# Patient Record
Sex: Female | Born: 2000 | Race: White | Hispanic: No | Marital: Single | Smoking: Never smoker
Health system: Southern US, Community
[De-identification: ages and names within clinical notes are randomized; demographics above are authoritative.]

## PROBLEM LIST (undated history)

## (undated) DIAGNOSIS — G43109 Migraine with aura, not intractable, without status migrainosus: Secondary | ICD-10-CM

## (undated) DIAGNOSIS — E039 Hypothyroidism, unspecified: Secondary | ICD-10-CM

## (undated) DIAGNOSIS — F4310 Post-traumatic stress disorder, unspecified: Secondary | ICD-10-CM

## (undated) DIAGNOSIS — E559 Vitamin D deficiency, unspecified: Secondary | ICD-10-CM

## (undated) DIAGNOSIS — F32A Depression, unspecified: Secondary | ICD-10-CM

## (undated) DIAGNOSIS — A048 Other specified bacterial intestinal infections: Secondary | ICD-10-CM

## (undated) DIAGNOSIS — F419 Anxiety disorder, unspecified: Secondary | ICD-10-CM

## (undated) DIAGNOSIS — N926 Irregular menstruation, unspecified: Secondary | ICD-10-CM

## (undated) HISTORY — DX: Vitamin D deficiency, unspecified: E55.9

## (undated) HISTORY — DX: Post-traumatic stress disorder, unspecified: F43.10

## (undated) HISTORY — DX: Hypothyroidism, unspecified: E03.9

## (undated) HISTORY — DX: Anxiety disorder, unspecified: F41.9

## (undated) HISTORY — DX: Depression, unspecified: F32.A

## (undated) HISTORY — DX: Migraine with aura, not intractable, without status migrainosus: G43.109

## (undated) HISTORY — DX: Other specified bacterial intestinal infections: A04.8

## (undated) HISTORY — DX: Irregular menstruation, unspecified: N92.6

## (undated) HISTORY — PX: WISDOM TOOTH EXTRACTION: SHX21

---
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
## 2022-09-04 IMAGING — US US PELVIS COMPLETE
1 series · 14 of 25 positions shown · non-contrast
Comparison: None.

CLINICAL DATA: Pelvic pain

EXAM:
TRANSABDOMINAL ULTRASOUND OF PELVIS
TECHNIQUE: Transabdominal ultrasound examination of the pelvis was performed
including evaluation of the uterus, ovaries, adnexal regions, and
pelvic cul-de-sac.

[Series 1: us pelvis complete · 0.22mm/px · 14 of 48 slices shown]
[im 1/48]
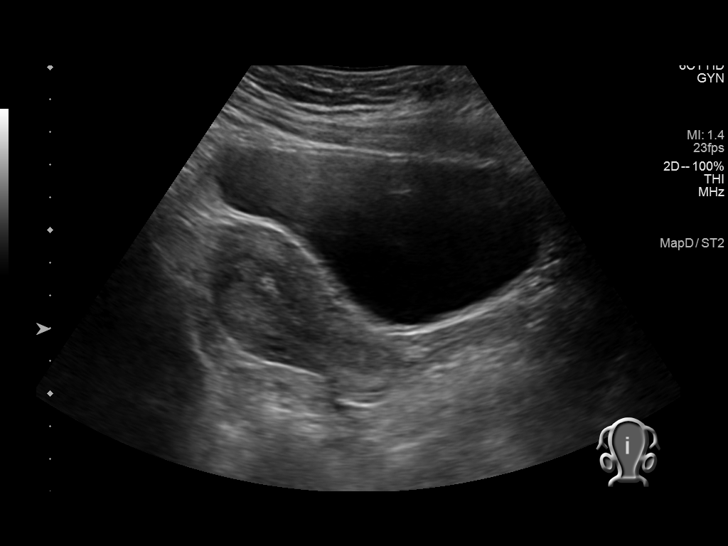
[im 4/48]
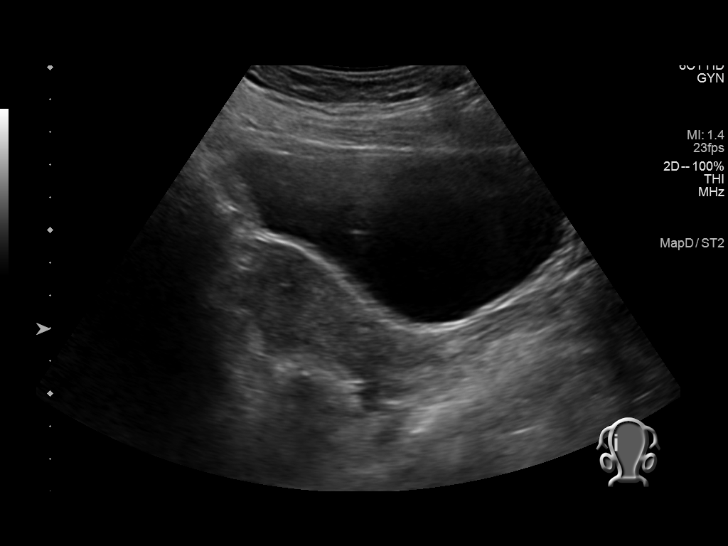
[im 8/48]
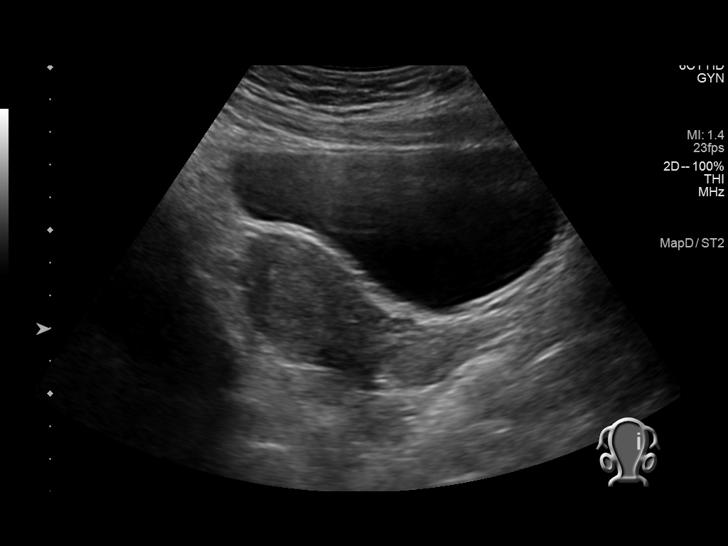
[im 12/48]
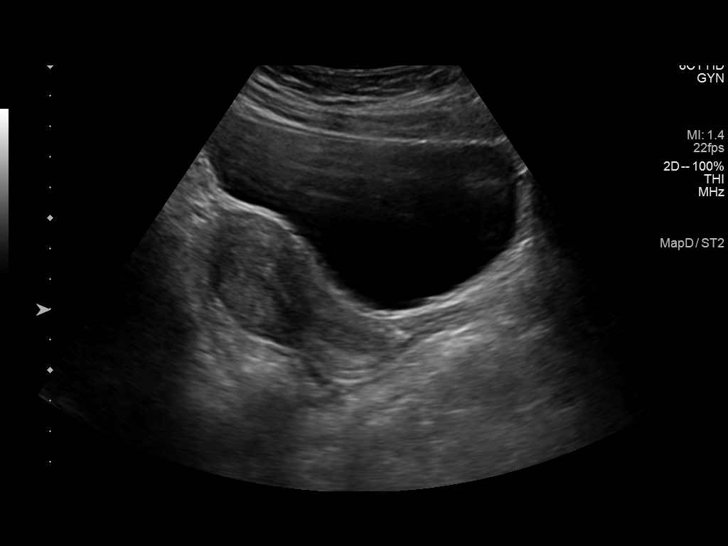
[im 16/48]
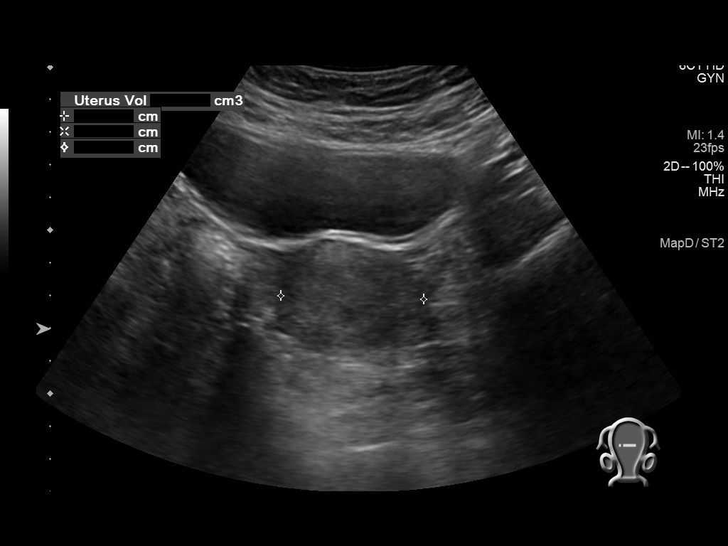
[im 18/48]
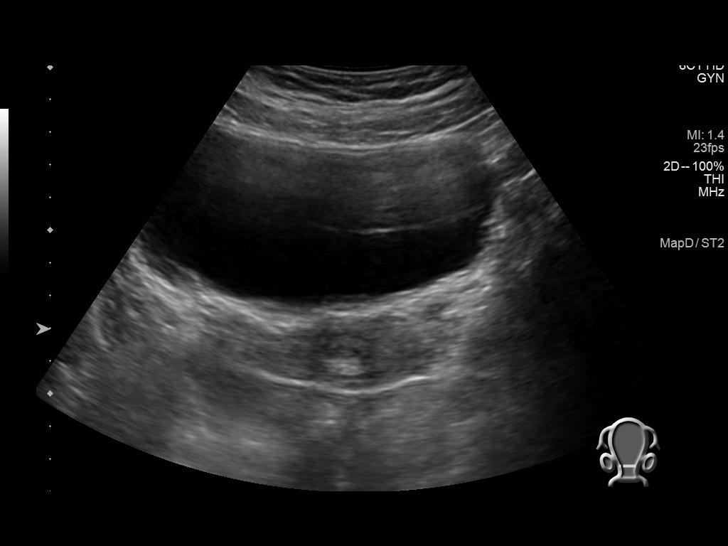
[im 22/48]
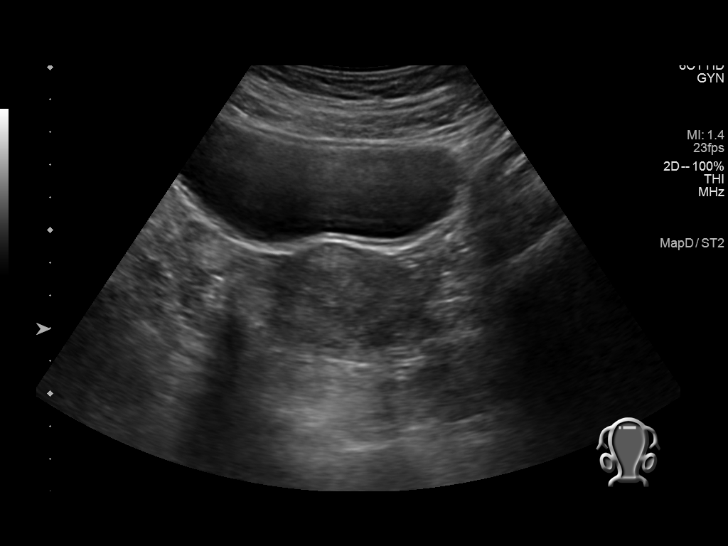
[im 26/48]
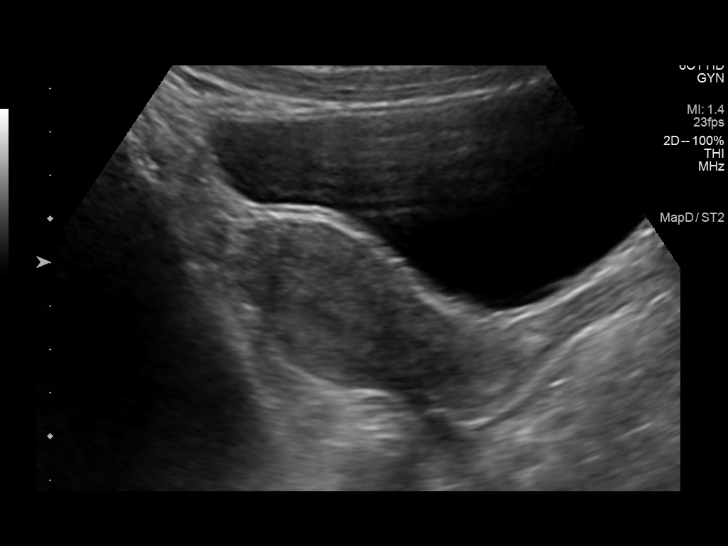
[im 30/48]
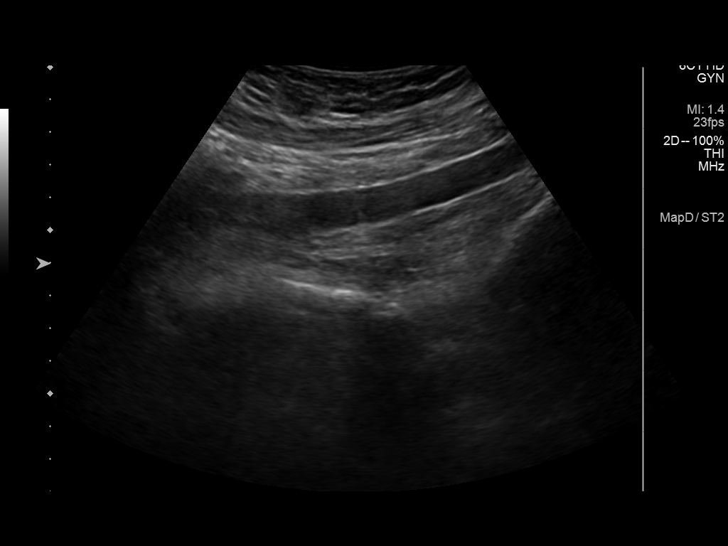
[im 32/48]
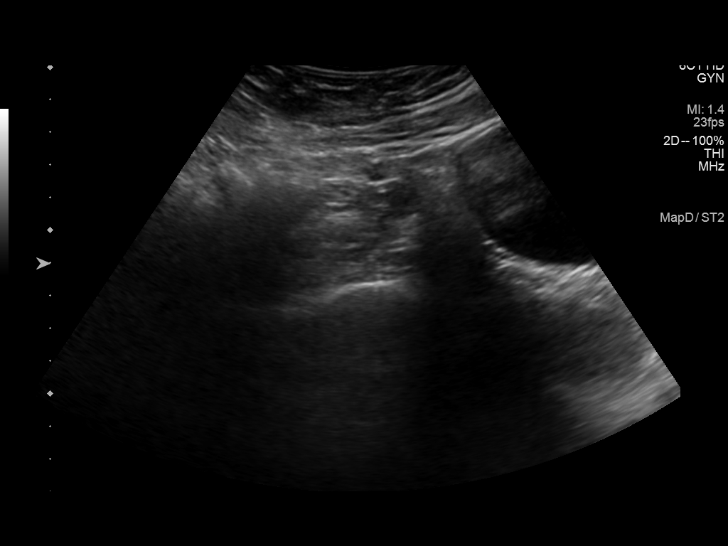
[im 36/48]
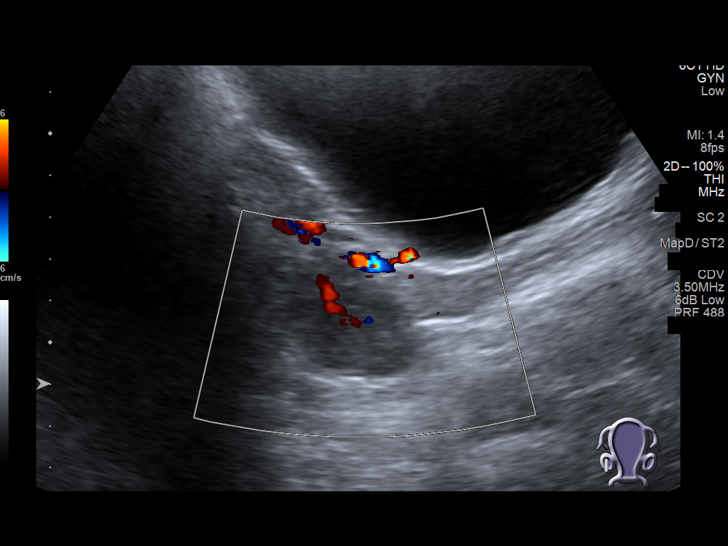
[im 40/48]
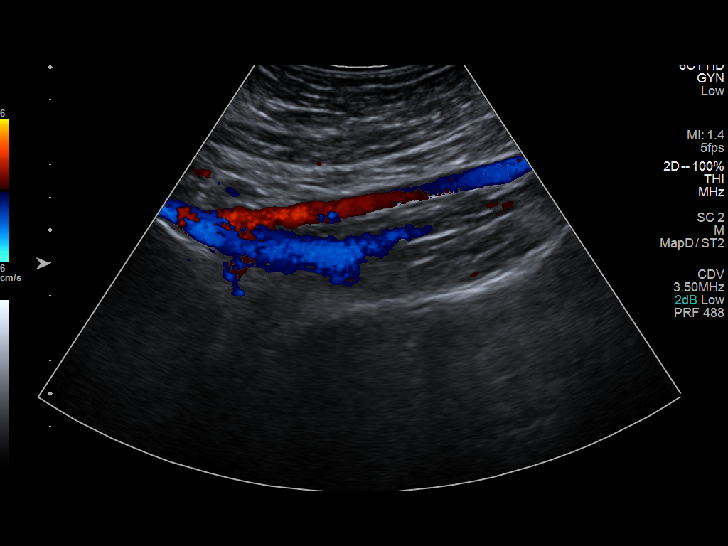
[im 44/48]
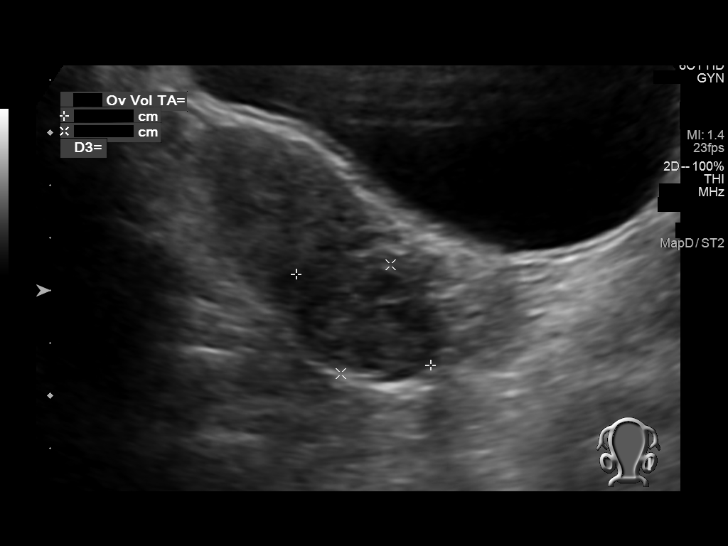
[im 48/48]
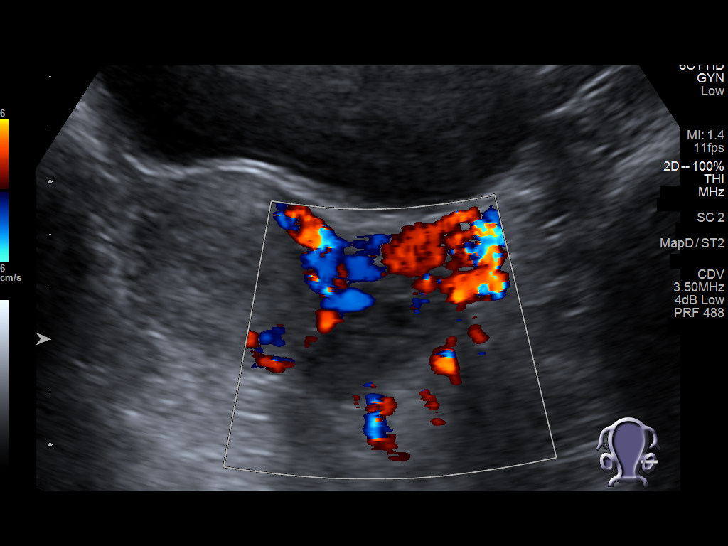

[14 of 25 positions shown; findings below may reference images not displayed]

FINDINGS: Uterus

Measurements: 7.2 x 3.8 x 4.4 cm = volume: 61.6 mL. No fibroids or
other mass visualized.

Endometrium

Thickness: 12 mm.  No focal abnormality visualized.

Right ovary

Measurements: 3.6 x 2.3 x 2.6 cm = volume: 10.9 mL. Normal
appearance/no adnexal mass.

Left ovary

Measurements: 3.1 x 2.3 x 2.1 cm = volume: 7.7 mL. Normal
appearance/no adnexal mass.

Other findings:  No abnormal free fluid.
IMPRESSION: Negative pelvic ultrasound

## 2023-02-27 IMAGING — CR DG ABDOMEN 1V
1 series · 2 of 2 positions shown · non-contrast
Comparison: None.
COMPARISON: None.

Addendum:
CLINICAL DATA: Rectal bleeding, abdominal cramping, vomiting

EXAM:
ABDOMEN - 1 VIEW

[Series 1: dg abd 1 view · 0.14mm/px · 2 of 2 slices shown]
[im 1/2]
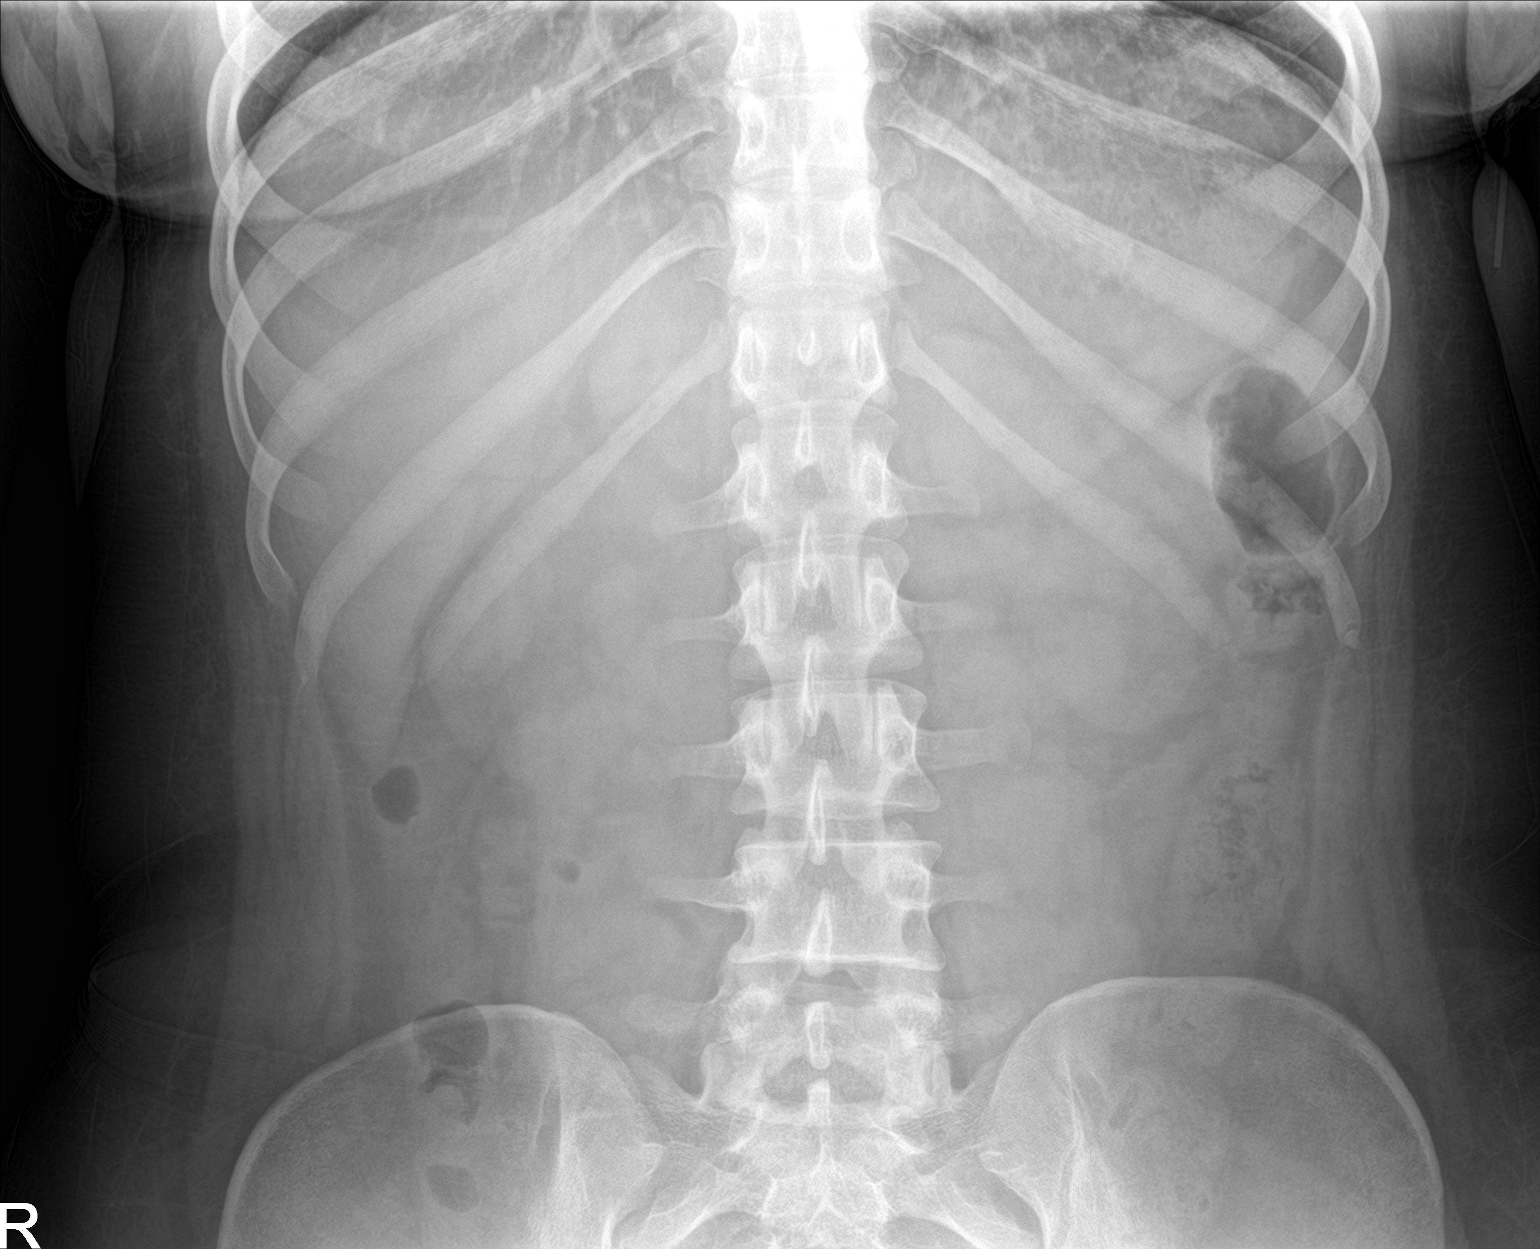
[im 2/2]
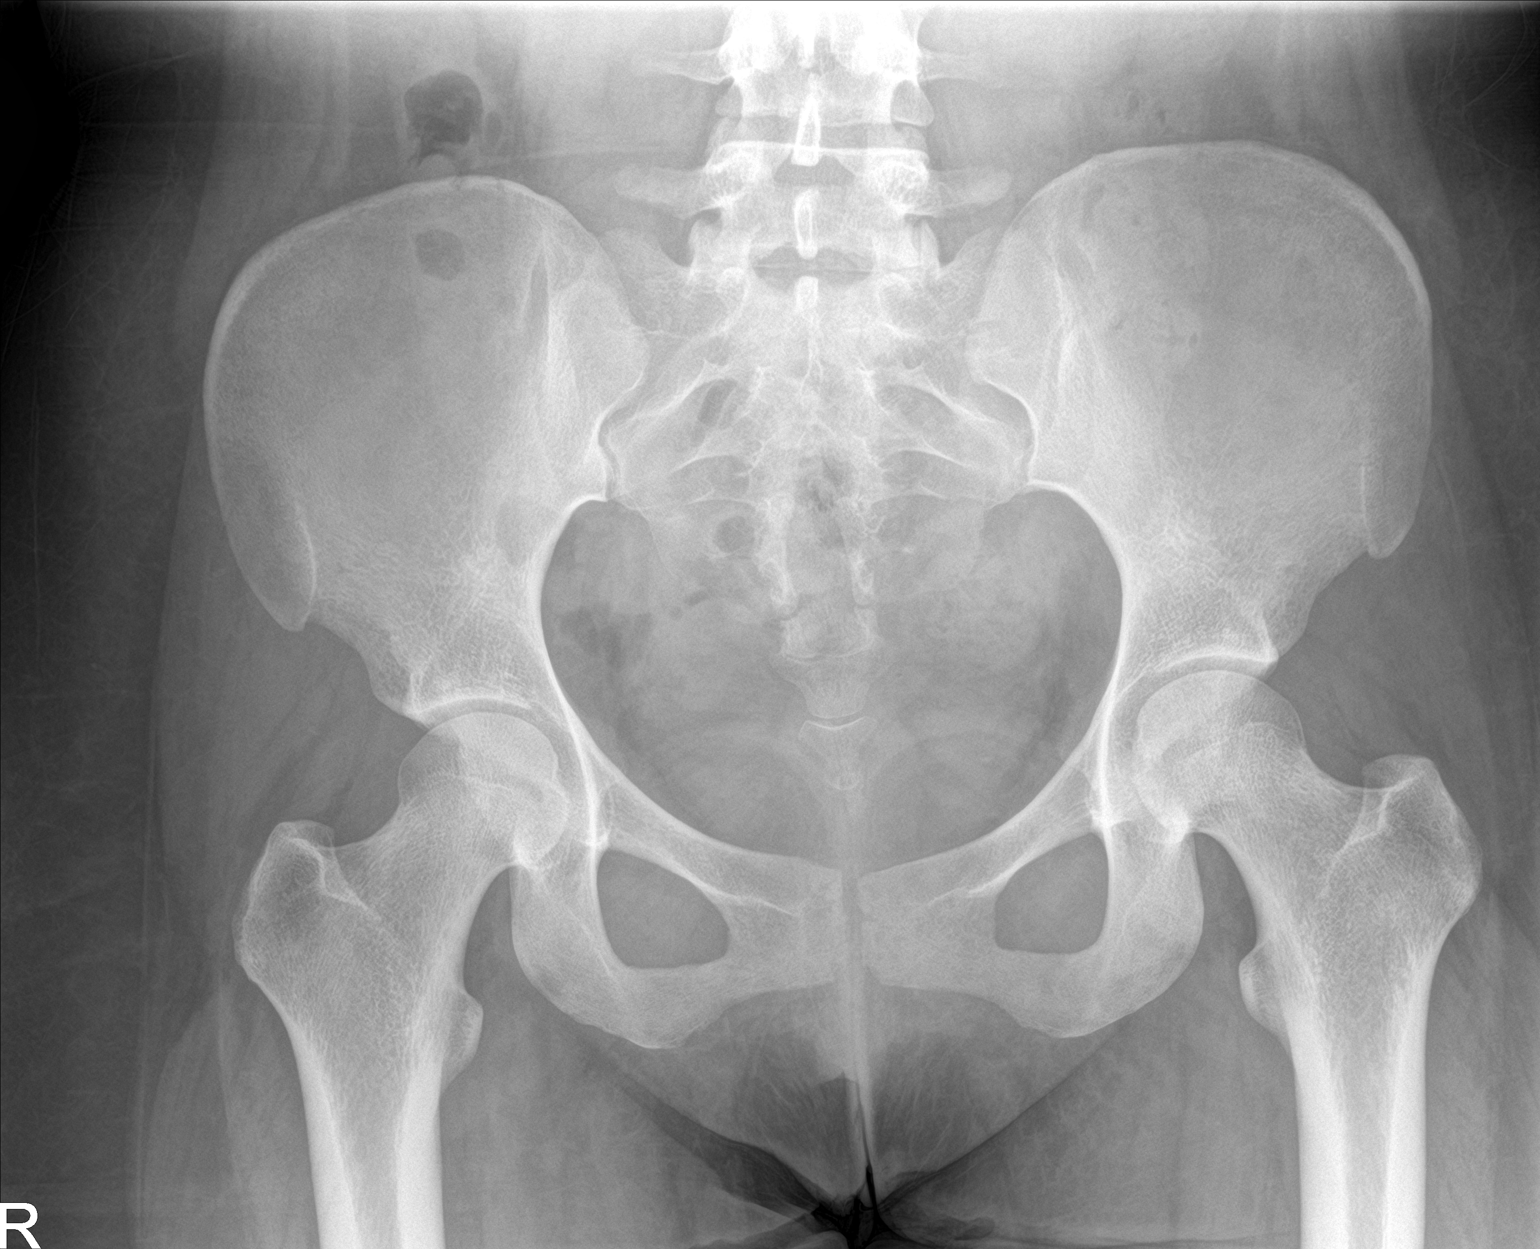

[2 of 2 positions shown; findings below may reference images not displayed]

FINDINGS: The bowel gas pattern is normal. No radio-opaque calculi or other
significant radiographic abnormality are seen.
IMPRESSION: Negative.

ADDENDUM:
There is little stool identified within the descending and sigmoid
colon.

*** End of Addendum ***
FINDINGS: The bowel gas pattern is normal. No radio-opaque calculi or other
significant radiographic abnormality are seen.
IMPRESSION: Negative.
